# Patient Record
Sex: Female | Born: 1935 | Race: Black or African American | Hispanic: No | Smoking: Never smoker
Health system: Southern US, Community
[De-identification: ages and names within clinical notes are randomized; demographics above are authoritative.]

## PROBLEM LIST (undated history)

## (undated) DIAGNOSIS — H409 Unspecified glaucoma: Secondary | ICD-10-CM

## (undated) DIAGNOSIS — F0390 Unspecified dementia without behavioral disturbance: Secondary | ICD-10-CM

## (undated) DIAGNOSIS — H547 Unspecified visual loss: Secondary | ICD-10-CM

## (undated) DIAGNOSIS — I10 Essential (primary) hypertension: Secondary | ICD-10-CM

## (undated) HISTORY — DX: Essential (primary) hypertension: I10

## (undated) HISTORY — DX: Unspecified visual loss: H54.7

## (undated) HISTORY — DX: Unspecified dementia, unspecified severity, without behavioral disturbance, psychotic disturbance, mood disturbance, and anxiety: F03.90

## (undated) HISTORY — DX: Unspecified glaucoma: H40.9

---

## 2005-06-15 ENCOUNTER — Other Ambulatory Visit: Admission: RE | Admit: 2005-06-15 | Discharge: 2005-06-15 | Payer: Self-pay | Admitting: Family Medicine

## 2005-11-02 ENCOUNTER — Inpatient Hospital Stay (HOSPITAL_COMMUNITY): Admission: EM | Admit: 2005-11-02 | Discharge: 2005-11-07 | Payer: Self-pay | Admitting: Emergency Medicine

## 2005-11-07 ENCOUNTER — Ambulatory Visit: Payer: Self-pay | Admitting: Internal Medicine

## 2005-11-09 ENCOUNTER — Ambulatory Visit: Payer: Self-pay | Admitting: Internal Medicine

## 2006-04-05 ENCOUNTER — Emergency Department (HOSPITAL_COMMUNITY): Admission: EM | Admit: 2006-04-05 | Discharge: 2006-04-05 | Payer: Self-pay | Admitting: Emergency Medicine

## 2007-02-08 HISTORY — PX: FRACTURE SURGERY: SHX138

## 2007-07-16 ENCOUNTER — Ambulatory Visit: Payer: Self-pay | Admitting: Cardiology

## 2007-07-27 ENCOUNTER — Ambulatory Visit: Payer: Self-pay | Admitting: Cardiology

## 2007-07-28 ENCOUNTER — Encounter: Payer: Self-pay | Admitting: Ophthalmology

## 2007-07-28 ENCOUNTER — Inpatient Hospital Stay (HOSPITAL_COMMUNITY): Admission: EM | Admit: 2007-07-28 | Discharge: 2007-08-03 | Payer: Self-pay | Admitting: Emergency Medicine

## 2007-12-17 ENCOUNTER — Encounter: Admission: RE | Admit: 2007-12-17 | Discharge: 2007-12-17 | Payer: Self-pay | Admitting: *Deleted

## 2008-06-09 ENCOUNTER — Encounter: Admission: RE | Admit: 2008-06-09 | Discharge: 2008-06-09 | Payer: Self-pay | Admitting: *Deleted

## 2009-07-11 IMAGING — CR DG CHEST 1V
1 series · 1 of 1 positions shown · non-contrast
Comparison: 11/06/2005

CLINICAL DATA: , fall, left hip fracture

CHEST - 1 VIEW

[t chest supine]
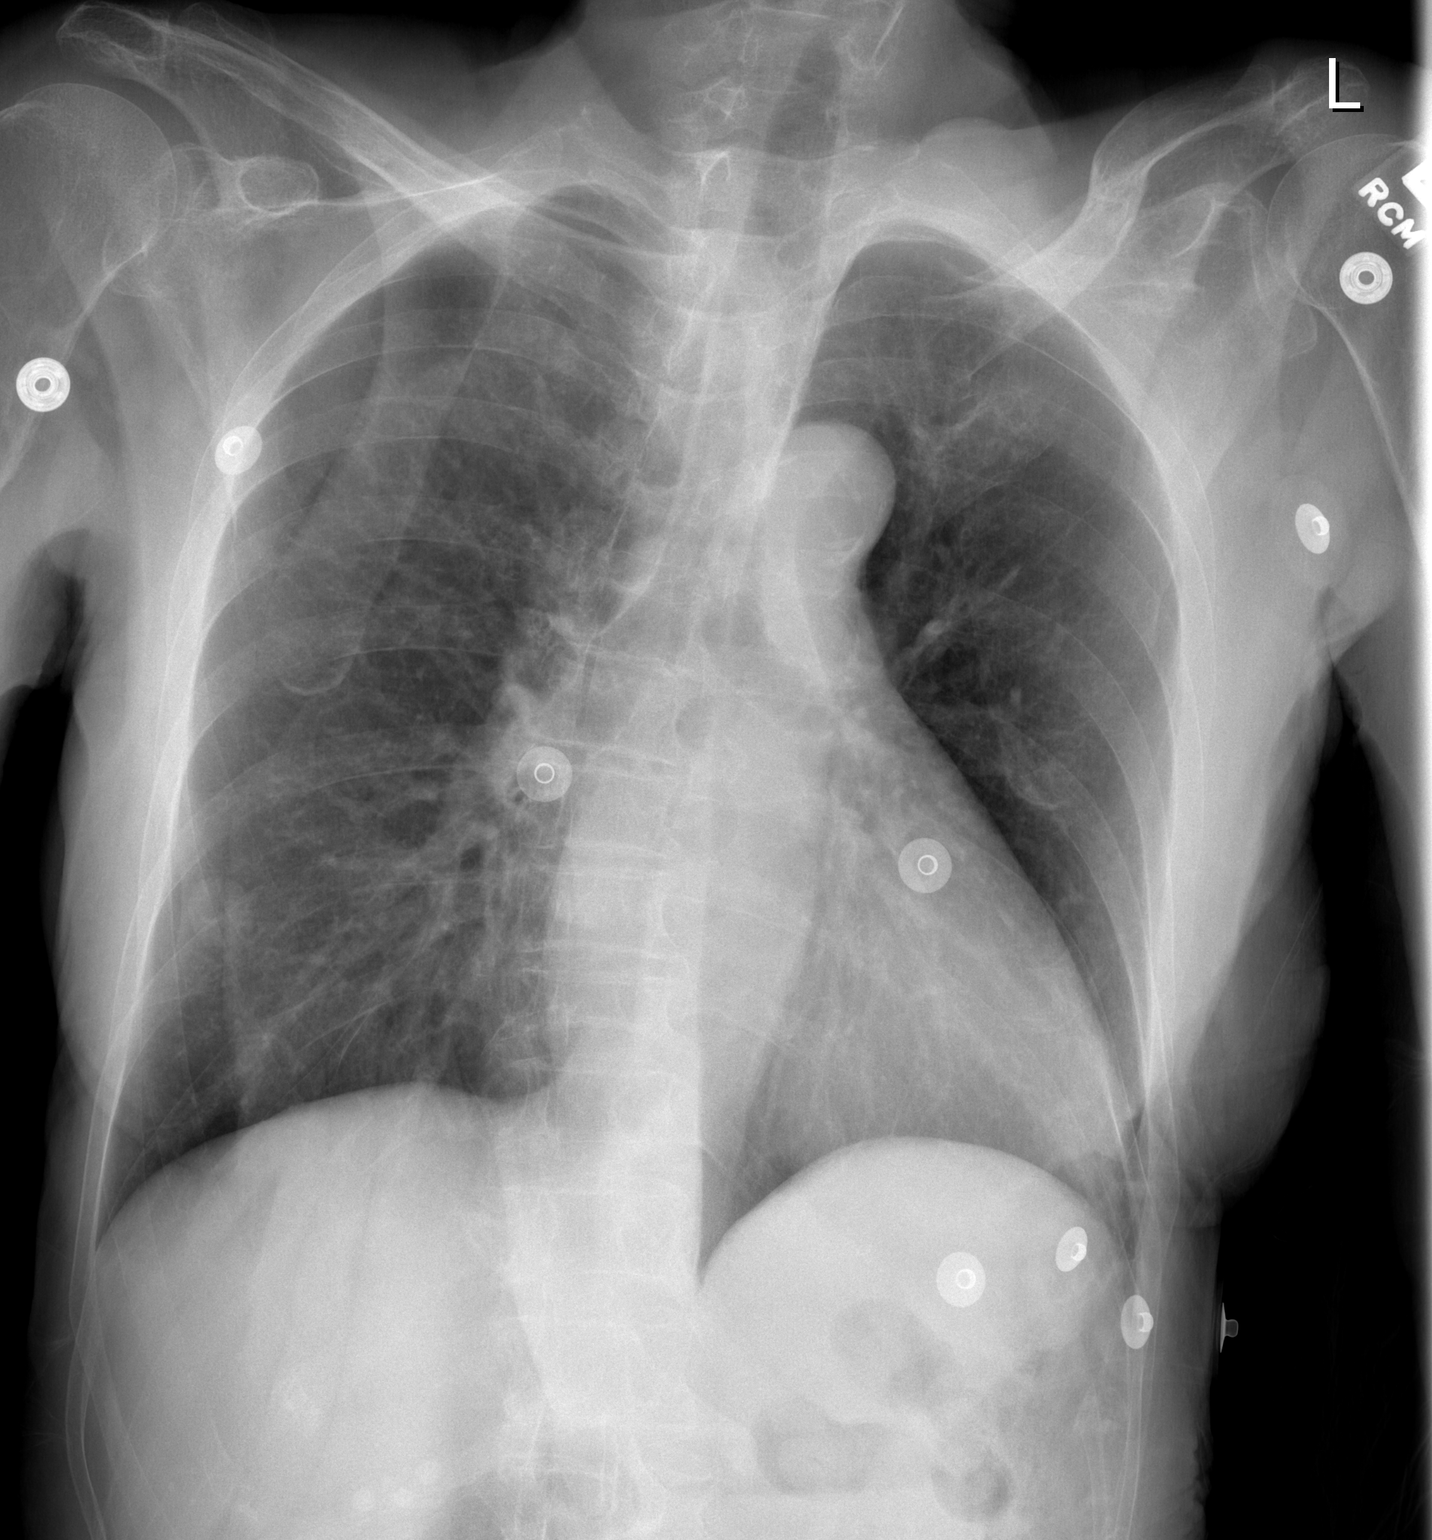

[1 of 1 positions shown; findings below may reference images not displayed]

FINDINGS: Cardiomegaly is present.  There are no acute infiltrates
or failure.  Nodular densities project over the lower lung zones,
likely representing nipples.  Skeletal osteopenia is noted with
mild thoracic scoliosis convex right.
IMPRESSION: Cardiomegaly, no active disease; increased cardiac size from priors
otherwise no significant change.

Nodular densities projecting over both lower lung zones likely
represent nipple shadows.  Correlate clinically

## 2009-07-12 IMAGING — RF DG HIP OPERATIVE*L*
1 series · 2 of 2 positions shown · non-contrast
Comparison: 07/27/2007

CLINICAL DATA: Status post hip pinning

OPERATIVE LEFT HIP

[Series 1: run · 2 of 2 slices shown]
[im 1/2]
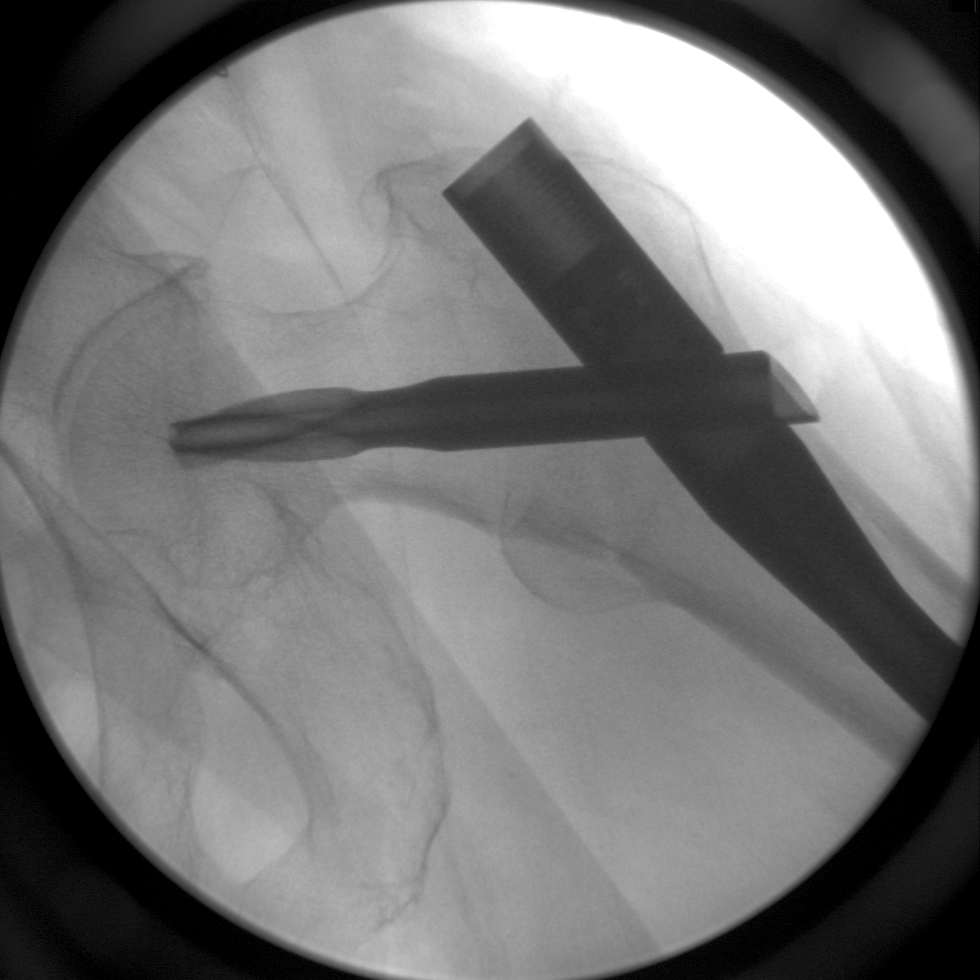
[im 2/2]
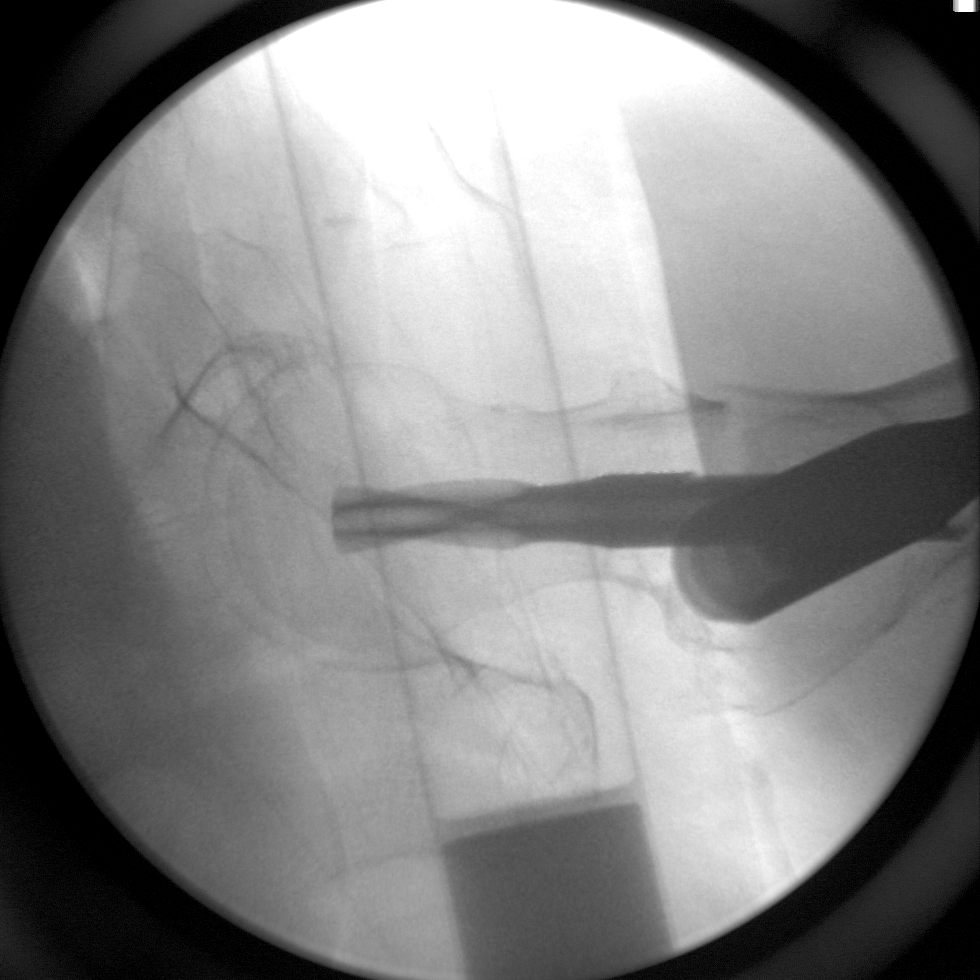

[2 of 2 positions shown; findings below may reference images not displayed]

FINDINGS: Two intraoperative films from C-arm radiography show
placement of medullary rod and hip pin..  The fracture fragments
are in anatomic alignment and no complicating features are
identified.
IMPRESSION: Status post open reduction and internal fixation of left hip
fracture.

## 2010-05-25 IMAGING — CR DG FEMUR 2+V*R*
5 series · 5 of 5 positions shown · non-contrast
Comparison: AP pelvis and lumbar spine series 04/05/2006.

CLINICAL DATA: 89-year-old female status post fall 1 week ago.
Right leg pain and limping for 1 week.  The patient also complains
of left hip pain.  Status post left hip surgery 1 year ago.

PELVIS - 1-2 VIEW,
RIGHT FEMUR - 2 VIEW

[view not recorded (1 of 5)]
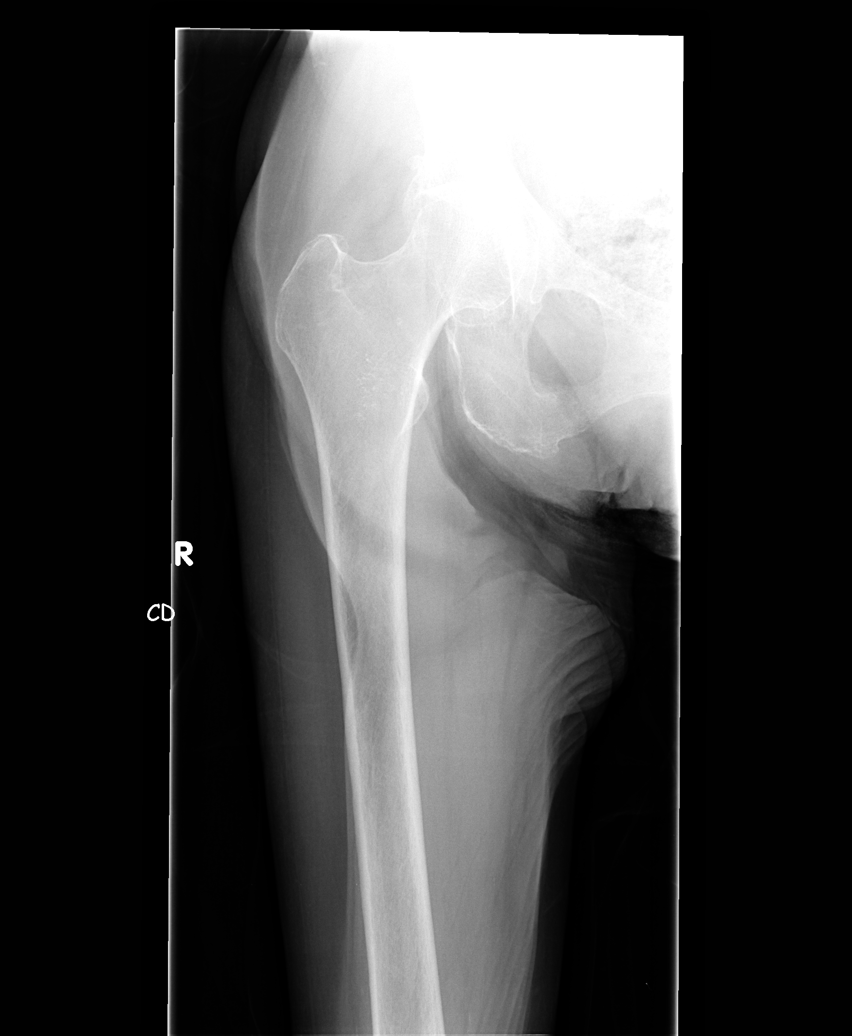

[view not recorded (2 of 5)]
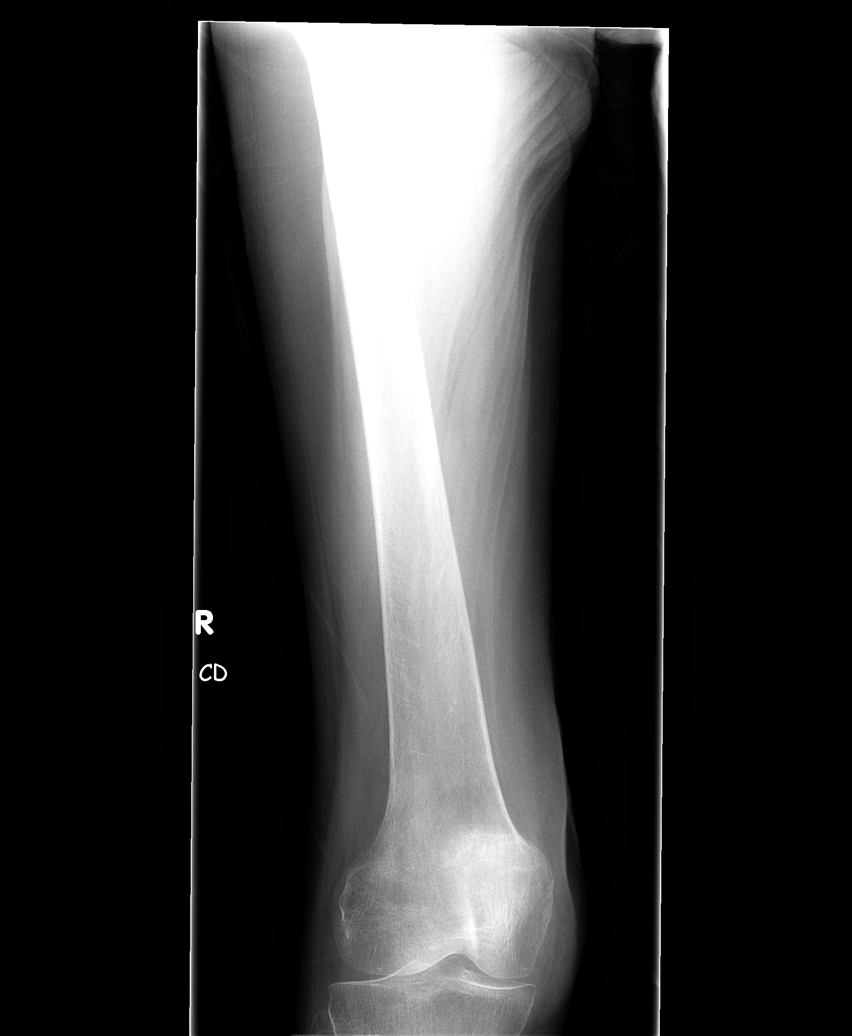

[view not recorded (3 of 5)]
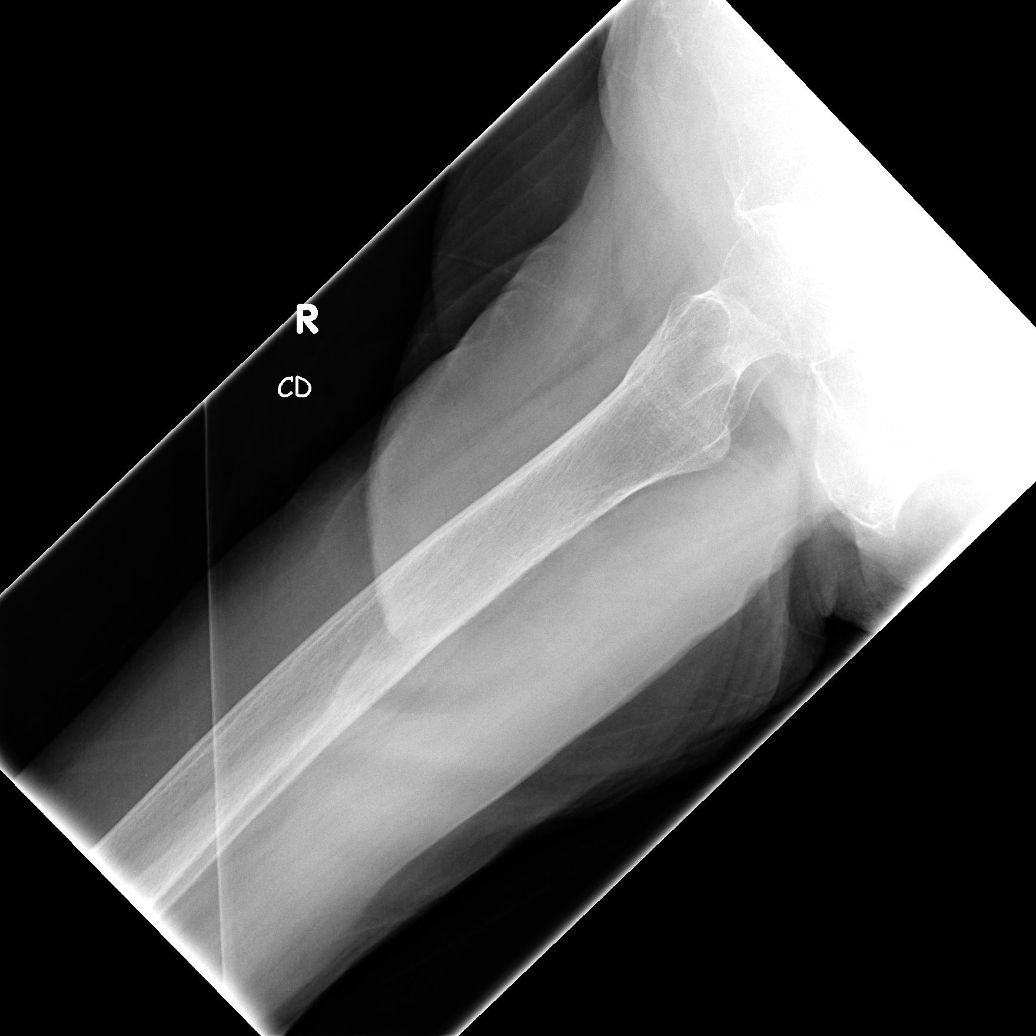

[view not recorded (4 of 5)]
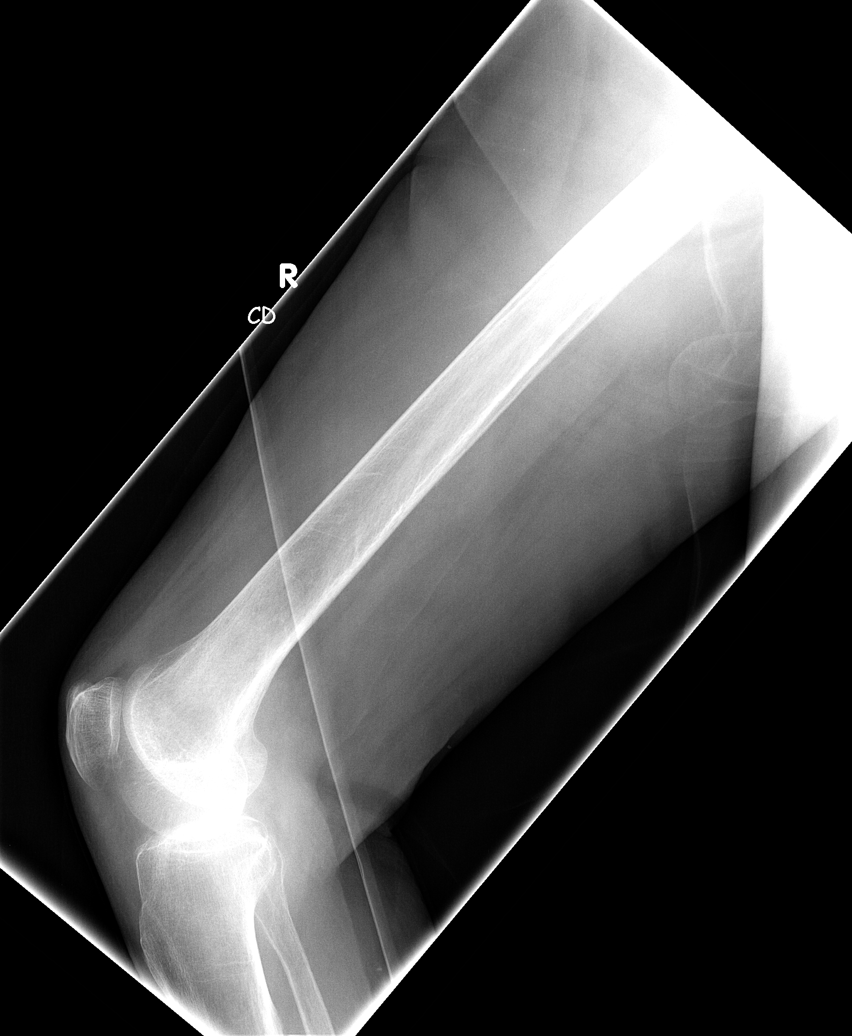

[view not recorded (5 of 5)]
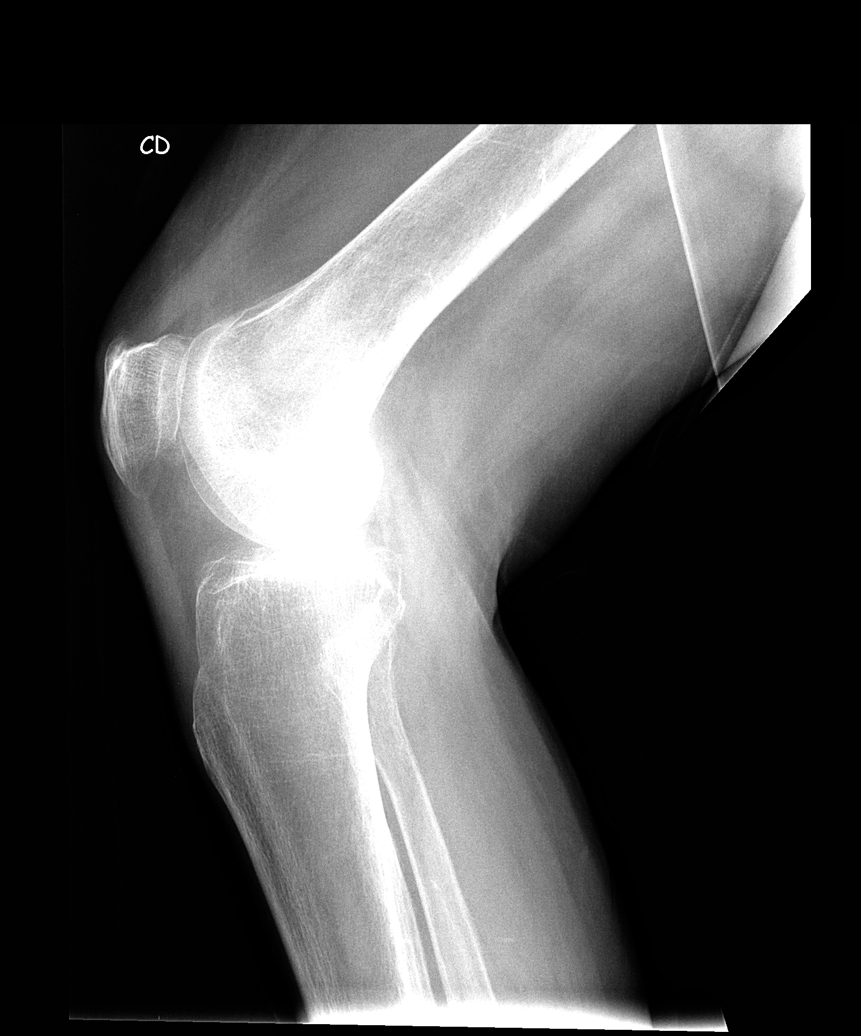

[5 of 5 positions shown; findings below may reference images not displayed]

FINDINGS: Pelvis:  Diffuse osteopenia.  Interval sequelae of dynamic left hip
screw and left femur intramedullary rod placement.  Heterotopic
ossification about the greater trochanter is noted.  The left
femoral head appears normally located.  The visualized left femoral
hardware appears intact, but there is lucency surrounding the
cephalad most aspect of the IM rod.  There is irregularity and
subtle fragmentation of the left inferior pubic ramus.  The
remaining pubic rami appear intact.  Fibroid calcifications in the
pelvis are again noted.  Iliac wings and sacrum appear grossly
intact.  There is apparent compression of the L4 vertebral body
which, if genuine, is new from the previous exams.

Right femur: Diffuse osteopenia. Right femoral head is normally
located.  Right hip joint space is stable.  Visualized right hemi
pelvis appears intact. Right femur appears intact.
IMPRESSION: 1.  Minimally-displaced left inferior pubic ramus fracture
suspected.  Pelvis otherwise appears intact.
2.  Lucency adjacent to the proximal left femur hardware suspicious
for loosening. Visualized left femoral appears intact.
3.  Right femur appears intact.
4.  Evidence of interval L4 compression fracture.

## 2010-06-22 NOTE — Discharge Summary (Signed)
NAMEVINETA, CARONE              ACCOUNT NO.:  [id number]   MEDICAL RECORD NO.:  [id number]          PATIENT TYPE:  INP   LOCATION:  3715                         FACILITY:  [facility]   PHYSICIAN:  Lonia Blood, M.D.DATE OF BIRTH:  1919/11/12   DATE OF ADMISSION:  07/27/2007  DATE OF DISCHARGE:  08/03/2007                               DISCHARGE SUMMARY   PRIMARY CARE PHYSICIAN:  Immunologist Care.   ORTHOPEDIST:  Veverly Fells. Ophelia Charter, M.D.   DISCHARGE DIAGNOSES:  1. Left intertrochanteric and subtrochanteric hip fracture, status      post surgical correction.  2. Postoperative anemia requiring blood transfusion.  3. Hypertension.  4. Sinus bradycardia.  5. Severe dementia.  6. Glaucoma.  7. One out of two positive blood cultures for coagulase-negative      Staphylococcus, not felt to be clinically significant.   DISCHARGE MEDICATIONS:  1. Aspirin enteric-coated 81 mg p.o. daily.  2. Senokot 1 tablet p.o. nightly.  3. Namenda 5 mg p.o. b.i.d.  4. Depakote 125 mg p.o. daily.  5. Xalatan ophthalmic solution 0.05% 1 drop in each eye daily.  6. Alphagan 0.2% ophthalmic solution 1 drop in each eye b.i.d.  7. Timoptic 0.5% ophthalmic solution 1 drop in each eye b.i.d.  8. Protonix 40 mg p.o. daily.  9. Lovenox 40 mg subcutaneously q.24 h. until such time that the      patient's mobility is improved to the point that she no longer is      felt to require DVT prophylaxis.  10.Zofran 4 mg p.o. q.8 h. p.r.n.  11.Tylenol 650 mg p.o. q.4 h. p.r.n.  12.Oxycodone 4 mg p.o. q.4 h. p.r.n.  13.Dulcolax suppository 10 mg per rectum p.r.n.   FOLLOW-UP:  1. The patient is advised to follow up with Dr. Annell Greening in his      office in 2 weeks.  The nursing facility should call his office to      arrange for this follow-up.  2. Ongoing primary care will be provided by the attending of record at      the patient's nursing home of choice.  The patient's hemoglobin      should be followed up  within the week to assure that it remains      stable.  At the time of her discharge her hemoglobin is 10.2.   PROCEDURES:  1. Left intramedullary trochanteric nail with 85-mm lag screw July 28, 2007, by Dr. Annell Greening.  2. CT scan of the head July 27, 2007:  No acute intracranial      abnormalities.  Chronic small-vessel ischemic change and brain      atrophy.  3. CT scan of the cervical spine:  Negative for acute fracture-      dislocation.   CONSULTATIONS:  1. Mark C. Ophelia Charter, MD, with orthopedic surgery.  2. Luis Abed, MD, with [facility].   HOSPITAL COURSE:  Ms. Simren Popson is an 77 year old female who was  cared for previously in a private residence with close attention from  her family.  She was found  down on the floor of the bathroom at her  home.  When she was awakened, she was drowsy and somewhat confused.  The  patient does have a significant baseline dementia.  She was brought to  the emergency room.  There she was diagnosed with a left  intertrochanteric and subtrochanteric hip fracture.  CT scan of the head  was unrevealing.  Clinically the patient appeared stable otherwise.  She  did have a history of bradycardia.  She was admitted to the medical  service for evaluation and clearance.  Cardiology was consulted.  The  patient was felt to be stable for surgery with no further intervention.  The patient was taken to the operating room on July 28, 2007, and  underwent the above-specified procedures.  Postoperatively the patient  suffered acute blood loss anemia.  She required transfusion of blood  products.  With said transfusion, the patient improved significantly.  Sinus bradycardia was noted throughout the hospitalization but no  significant hypotension or symptoms were appreciated.  The patient did  have a low-grade fever postoperatively.  Blood cultures and urine  cultures were obtained.  Urinalysis and urine culture were unrevealing.  Chest  x-ray was unrevealing.  One out of two blood cultures did return  positive for gram-positive cocci, which turned out to be coagulase-  negative staph.  The patient had been empirically covered with  vancomycin for this.  There was no evidence of wound infection per the  orthopedic service and no other clinical evidence of infection.  As a  result, antibiotics were not felt to be necessary at the present time.  Physical therapy and occupational therapy evaluated the patient and it  was felt that she would require a skilled nursing facility for very long-  term rehab from the standpoint of her left hip fracture.  Arrangements  were made via case management.  By August 03, 2007, the patient was deemed  to be stable from a medical standpoint for discharge to a skilled  nursing facility.  She was cleared from an orthopedic standpoint as  well.  Follow-up is as noted above.   WOUND CARE:  The patient's left hip wound is to be dressed with a  Mepilex dressing and changed on a daily basis.   ACTIVITY:  Physical therapy and occupational therapy should evaluate the  patient on an ongoing basis in the nursing facility.  She is  weightbearing as tolerated for her left hip.  Further advancement will  be made after follow-up with Dr. Ophelia Charter.      Lonia Blood, M.D.  Electronically Signed     JTM/MEDQ  D:  08/03/2007  T:  08/03/2007  Job:  [id number]   cc:   [facility] C. Ophelia Charter, M.D.

## 2010-06-22 NOTE — Assessment & Plan Note (Signed)
[facility]                            CARDIOLOGY OFFICE NOTE   TARRI, GUILFOIL                       MRN:          [id number]  DATE:07/16/2007                            DOB:          29-Jun-1919    Ms. Pat is referred for clearance for eye surgery and for the  evaluation of bradycardia.  The patient is 77.  She is here with her  daughter.  She has significant Alzheimer's.  At this point, she is taken  to a day care unit and mostly sits there all day and waits for her  daughter to come back in the afternoon.  She is able to walk when she is  with something.  She is not having chest pain or shortness of breath.  There has been no syncope or presyncope.  She does have some peripheral  edema.  Her daughter says that in fact her peripheral edema is somewhat  better than it has been.  There is no prior documented coronary disease.   The patient has some type of eye finding, and there is a question as to  whether or not she will have surgery.  That will be up to  Dr. Ashley Royalty and the patient's daughter.  However, from the viewpoint of  her heart, we need to decide if it is safe with her bradycardia.   PAST MEDICAL HISTORY:   ALLERGIES:  No known drug allergies.   MEDICATIONS:  Aspirin, Namenda, Exelon, Depakote, and eyedrops.   OTHER MEDICAL PROBLEMS:  See the list below.   SOCIAL HISTORY:  The patient is living with her daughter and as  mentioned has significant Alzheimer's.   FAMILY HISTORY:  The family history is noncontributory.   REVIEW OF SYSTEMS:  At this point, review of systems is limited.  She is  not having any major complaints at this moment.   PHYSICAL EXAMINATION:  Weight is 110 pounds.  Blood pressure is 190/81  with a pulse of 43.  The patient is responsive.  She is not fully oriented.  HEENT:  No xanthelasma.  She has normal extraocular motion.  There are no carotid bruits.  There is no jugular venous distention.  LUNGS:   Lungs are clear.  Respiratory effort is not labored.  CARDIAC:  An S1 with an S2.  There are no clicks or significant murmurs.  ABDOMEN:  Soft.  There is 1+ peripheral edema.   EKG reveals a sinus bradycardia with a rate of 41.   PROBLEMS:  1. Significant Alzheimer's.  2. Eye abnormality with question of need for eye surgery.  3. Marked bradycardia with no symptoms.  4. Hypertension.  5. Mild peripheral edema.   The patient's bradycardia certainly is impressive.  However, she has  absolutely no symptoms.  She is not hypotensive.  In fact, she is  hypertensive.  She has some mild peripheral edema.  She has not had any  syncope or presyncope, and she has significant Alzheimer's at the age  77.  At this time, I feel that it is not indicated to place a pacemaker.  I explained  to the daughter why I felt this.  She understands.  I would  suggest proceeding with the eye surgery if it is felt to be clinically  indicated.  I would also consider very mild medications for her blood  pressure.  No further workup is needed for her bradycardia at this time.  Also, she is cleared for eye surgery.     Luis Abed, MD, [facility]  Electronically Signed    JDK/MedQ  DD: 07/16/2007  DT: 07/17/2007  Job #: [id number]   cc:   Beulah Gandy. Ashley Royalty, M.D.  Samara Snide, MD

## 2010-06-22 NOTE — H&P (Signed)
[name], [name]              ACCOUNT NO.:  [id number]   MEDICAL RECORD NO.:  [id number]          PATIENT TYPE:  INP   LOCATION:  3703                         FACILITY:  [facility]   PHYSICIAN:  Lucita Ferrara, MD         DATE OF BIRTH:  May 29, 1919   DATE OF ADMISSION:  07/27/2007  DATE OF DISCHARGE:                              HISTORY & PHYSICAL   The patient is an 77 year old presenting with status post fall, brought  in by EMS.  Apparently the patient was found at home by daughter on the  floor of bathroom.  She was awakened.  She was drowsy and not talking  well.  Daughter and husband with the patient.  They are unsure about  when the patient fell or if there was any prodrome such as chest pain,  shortness of breath, nausea, vomiting, dizziness.  She was in her usual  state of health.  The patient has severe dementia, thus the rest of the  history is pretty unreliable.   PAST MEDICAL HISTORY:  1. Hypertension.  2. Alzheimer disease.  3. Glaucoma.  4. Peripheral edema.  5. Bradycardia.  Note that patient has recently been cleared by      [facility] for cataract surgery.  She was also evaluated by      [facility] for bradycardia.   SOCIAL HISTORY:  She lives with her daughter.  She is a nonsmoker.  Denies drugs or alcohol.  She is limited on her activities of daily  living for support.   ALLERGIES:  No known drug allergies.   MEDICATIONS:  Xalatan Ophthalmic, Combigan, Divalproex, Exelon, Bayer  Aspirin.  Doses not yet verified.   PHYSICAL EXAMINATION:  She is a cachectic, uncomfortable-appearing  female.  Blood pressure is 186/81, pulse 54, respirations 18, temperature 98.9,  pulse oximetry 99%.  HEENT:  Normocephalic, atraumatic.  Mucous membranes dry.  CARDIOVASCULAR:  S1, S2.  Regular rate and rhythm.  No murmurs, rubs,  clicks.  ABDOMEN:  Soft, nontender, not distended.  Positive bowel sounds.  LUNGS:  Clear to auscultation bilaterally.  No rhonchi,  rales or  wheezes.  EXTREMITIES:  Trace edema.  There is deformity with rotation, shortening  to the left lower leg, some swelling to the left knee.  NEUROLOGIC:  Cannot be fully assessed secondary to her mental status.  Right arm strength 3/5.  Left arm strength 3/5.  Left leg strength 3/5  right.  Leg strength 3/5.   EMERGENCY ROOM COURSE:  The ED physician called Dr. Ophelia Charter, who suggested  calf compression rather than Lovenox, Buck's traction, n.p.o. after  midnight and he will see in the morning.  In the emergency room due to  potential head and cervical trauma, the patient was placed in a C-  collar.  CT of the head and C-spine were performed.  EKG shows sinus  bradycardia at 57, nonspecific ST-T wave changes   LABORATORY RESULTS:  Troponins negative.  CK-MB negative.  Complete  metabolic panel:  High glucose, otherwise normal.  Urinalysis normal.  Bleeding time normal.  CBC pending.  CT scan  of the head without  contrast shows no acute intracranial abnormalities, small-vessel  ischemic changes and brain atrophy.  C-spine CT:  C-spine shows negative  for fractures or dislocation.  Chest x-ray shows cardiomegaly, no active  cardiopulmonary disease, increased cardiac size from prior.  Knee two-  view:  Normal.  X-ray of the hip showed comminuted intertrochanteric  left hip fracture with angulation.   ASSESSMENT/PLAN:  An 77 year old status post fall and fracture of the  intertrochanteric left hip with angulation, status post orthopedics  recommendation, who will see in the morning.   1. Fall with comminuted fracture of intratrochanteric left hip with      angulation.  2. Bradycardia.  Will likely need cardiac clearance.  3. Uncontrolled hypertension.  4. Alzheimer disease.  5. Glaucoma.  6. Peripheral edema.   PLAN:  Will continue plan per orthopedics recommendations including  Buck's traction, n.p.o. after midnight.  Per orthopedics recommendation,  use SCD boots and not  Lovenox.  We will reinstitute home medications.  Control blood pressure with non-rate-controlling medicine.  Pain  control.  The rest of plans are dependent on her progress.  I will defer  to cardiology for full clearance prior to surgery.      Lucita Ferrara, MD  Electronically Signed     RR/MEDQ  D:  07/28/2007  T:  07/28/2007  Job:  [id number]

## 2010-06-22 NOTE — Consult Note (Signed)
[name], [name]              ACCOUNT NO.:  [id number]   MEDICAL RECORD NO.:  [id number]          PATIENT TYPE:  INP   LOCATION:  3703                         FACILITY:  [facility]   PHYSICIAN:  Darryl D. Prime, MD    DATE OF BIRTH:  14-Aug-1919   DATE OF CONSULTATION:  DATE OF DISCHARGE:                                 CONSULTATION   PRIMARY CARE PHYSICIAN:  Seraphine A. Soosaimanickam, M.D.   TOTAL VISIT TIME:  Approximately 58 minutes.   The patient could not give a history.  History was revised primarily by  the daughter due to significant confusion and dementia.  She is being to  [facility].   CONSULT PHYSICIAN:  [facility].   CARDIOLOGIST:  Luis Abed, MD.   REASON FOR CONSULT:  History of asymptomatic bradycardia.   CHIEF COMPLAINT:  The daughter brought in for hip pain and she was found  to have a hip fracture.   HISTORY OF PRESENT ILLNESS:  [name] is an 77 year old female with a  history of asymptomatic bradycardia, who underwent a preoperative  evaluation by Dr. Myrtis Ser on July 16, 2007.  The patient was scheduled to  have cataract surgery by Dr. Ashley Royalty.  She was cleared for surgery by  Dr. Myrtis Ser with no indication for a pacemaker at that time, but her  daughter also that she should not undergo the surgery.  The patient has  significant visual problems due to her cataract.  The patient apparently  fell today in her bathroom and when her daughter returned home, found  her on a floor in the bathroom with external rotation of the right hip  and for shortening of the right leg.  The patient in the emergency room  was given fentanyl IV fluids and was found to have an x-ray of the hip,  a comminuted fracture in the intertrochanteric region with angulation.  She has also osteopenia noted.  X-ray of the knee on the left was  unremarkable.  The patient have a history of low heart rates in the  range of 40s to 50s over the last month.  The patient is on Exelon.   PAST MEDICAL HISTORY:  Was obtained from her daughter.  1. History of peripheral edema.  2. History of Alzheimer disease.  3. History of asymptomatic bradycardia.  4. History of glaucoma.  5. History of hypertension.  6. History of fall.  7. History of pubic ramus fracture in 2007.  8. She has a history of cataracts.   ALLERGIES:  No known drug allergies.   MEDICATIONS:  Unsure of the dose, but she is on aspirin, Depakote,  Namenda, Exelon, and Xalatan and Combigan are eye drops.   SOCIAL HISTORY:  She lives with her daughter and her daughter's husband.  The patient was a former nurse work with surgery.  The patient is  widowed.  No history of tobacco, alcohol, or illicit drug use.   FAMILY HISTORY:  Her mother died of old age, but her father was deceased  at a young age, unsure of the related illness was unknown.   REVIEW  OF SYSTEMS:  Could not obtain secondary to the patient being  confused with a history of dementia.   PHYSICAL EXAMINATION:  VITAL SIGNS:  Temperature is 98.9 with pulse of  64, respiratory rate of 14, blood pressure 172/88 with saturations of  99% on room air.  GENERAL:  The patient is very thin, lying flat in bed in no acute  distress.  HEENT:  Normocephalic and atraumatic.  Cataracts were seen.  The  patient's oropharynx was very dry.  NECK:  Supple with no lymphadenopathy or thyromegaly.  No carotid  bruits.  CARDIOVASCULAR:  Regular rhythm and rate with no murmurs, rubs, or  gallops.  Normal S1 and S2.  No S3 or S4.  LUNGS:  Clear to auscultation bilaterally.  ABDOMEN:  Scaphoid, soft, nontender, and nondistended with no  hepatosplenomegaly.  EXTREMITIES:  Her left extremity is casted and under traction.  NEUROLOGIC:  The patient is aware of who she is, but she is not aware of  where she is.  She is hence not aware of what occurred recently.  She is  also unaware of the time.   LABORATORY DATA:  She had a white count of 10.4 with a hemoglobin of   12.6, hematocrit of 38, platelets 112, and segs of 91.  Sodium 142,  potassium 4.0, chloride 107, bicarb 27, BUN 15, creatinine 0.95 with a  glucose of 165.  Cardiac markers were negative at 2100.  Urinalysis was  negative.  T bili 1.8 otherwise normal LFTs.  CT of the head showed a  small vessel disease changes and the brain atrophy, but no acute  disease.  CT of C-spine was negative.  Chest x-ray showed possible  cardiomegaly, otherwise no acute disease.  The patient's EKG showed  normal sinus rhythm with a ventricular rate of  61 beats per minute.  PR  interval was 171, QRS 79, QT corrected at 411.   ASSESSMENT AND PLAN:  This is a patient with a history of asymptomatic  bradycardia, who now has a left hip fracture after a fall.  It is okay  that she undergo surgery, and considering the risk versus benefits, we  would prefer much better after having a surgery.  For her history of  bradycardia, we will continue her home medications.  She does have  significant hypertension.  Would avoid clonidine as there are cases of  bradycardia associated with clonidine.  I would avoid anticholinergic  such as Atropine due to her history of glaucoma.  Suggest dopamine if  she becomes bradycardic and also if her hypertension is significant.  We  will suggest nitroglycerin drip or sublingual nitroglycerin as needed.  We will follow closely with you.      Darryl D. Prime, MD  Electronically Signed     DDP/MEDQ  D:  07/28/2007  T:  07/28/2007  Job:  [id number]

## 2010-06-22 NOTE — Op Note (Signed)
NAMEEMILLY, LAVEY              ACCOUNT NO.:  [id number]   MEDICAL RECORD NO.:  [id number]          PATIENT TYPE:  INP   LOCATION:  3715                         FACILITY:  [facility]   PHYSICIAN:  Mark C. Ophelia Charter, M.D.    DATE OF BIRTH:  1919/08/18   DATE OF PROCEDURE:  07/28/2007  DATE OF DISCHARGE:                               OPERATIVE REPORT   PREOPERATIVE DIAGNOSIS:  Left intertrochanteric and subtrochanteric  fracture.   POSTOPERATIVE DIAGNOSIS:  Left intertrochanteric and subtrochanteric  fracture.   PROCEDURE:  Left intramedullary trochanteric nail Synthes 11 x 400 mm  with 85-mm lag screw.   SURGEON:  Mark C. Ophelia Charter, MD.   ANESTHESIA:  General endotracheal plus Marcaine local.   ESTIMATED BLOOD LOSS:  100 mL.   PROCEDURE:  After induction of general anesthesia, time-out procedure  after prepping and draping stabilization, the hip was prepped with  DuraPrep.  Extremity was draped using four squared towels in the large  shower curtain, Betadine Steri-Drape.  Preoperative Ancef was given.  Safety surgical checklist was performed.  Incision was made started  proximal to the trochanter.  Tip of the trochanter was identified,  checked under fluoroscopy with the confirmation, the reduction was  anatomic surprisingly for intertrochanter and subtrochanter.  The  Steinmann pin was drilled in, over reamed and then measured over the  skin appropriate length and 11 x 400 mm nail was selected, inserted.  Pin was drilled up using the external guide center extended first time  and measured at 985, it was selected which gave good cortical bite as it  was impacted in with the hammer.  It was locked proximally and then  screw was used to lock the 85-mm lag screw after AP and lateral  fluoroscopy showed it was in good position.  Guide was removed  laterally.  Wounds were irrigated and then closed with 0-Vicryl deep  layer, 2-0 Vicryl subcutaneous tissue, skin with staple closure.  Postop  dressing, Adaptic, 4x4s, ABD and transferred to recovery room.  Leg  lengths were equal.  Spot fluoro pictures were taken confirming  reduction.  Distal tip was checked at the knee and the patient was then  taken out of the well leg holder.  The traction was released prior to  tightening down and locking the compression screw and then transferred  to the recovery room in stable condition.  Instrument count and needle  count was correct.      Mark C. Ophelia Charter, M.D.  Electronically Signed     MCY/MEDQ  D:  07/28/2007  T:  07/29/2007  Job:  [id number]

## 2010-06-25 NOTE — H&P (Signed)
[name], [name]              ACCOUNT NO.:  [id number]   MEDICAL RECORD NO.:  [id number]          PATIENT TYPE:  EMS   LOCATION:  ED                           FACILITY:  [facility]   PHYSICIAN:  Melissa L. Ladona Ridgel, MD  DATE OF BIRTH:  09-18-1919   DATE OF ADMISSION:  11/02/2005  DATE OF DISCHARGE:                                HISTORY & PHYSICAL   CHIEF COMPLAINT:  Cannot walk secondary to a fall.   PRIMARY CARE PHYSICIAN:  Joycelyn Rua, M.D.   HISTORY OF PRESENT ILLNESS:  The patient is an 77 year old African-American  female with a history of dementia and glaucoma, who stepped off of her front  steps today, landing on the ground in the bushes.  The patient sustained a  pubic ramus fracture and has been unable to fully weightbear secondary to  pain.  She was seen by orthopedics in the emergency room.  We have been  requested to admit the patient for supportive care.   PAST MEDICAL HISTORY:  Glaucoma.  She denies hypertension and diabetes.  She  definitely has dementia.   PAST SURGICAL HISTORY:  None.   SOCIAL HISTORY:  She was a Engineer, civil (consulting), evidently working in surgery.  She denies  tobacco or ethanol.  She had twin girls and one other daughter and one  deceased child.  She is also widowed.   FAMILY HISTORY:  Mom is deceased with only age as her diagnosis.  Dad is  deceased young but medical illnesses were unknown.   ALLERGIES:  No known drug allergies.   MEDICATIONS:  1. Aspirin 81 mg.  2. Glaucoma drops which are unknown at the time.   PHYSICAL EXAMINATION:  VITAL SIGNS:  Temperature is 97.6, blood pressure  161/82, pulse of 55, respirations 18, saturation 98%.  GENERAL:  She is in no acute distress with mild confabulation.  HEENT:  She is normocephalic, atraumatic.  Pupils equal, round, and reactive  to light, extraocular muscles are intact.  Mucous membranes are moist.  NECK:  Supple.  There is no JVD, no lymph nodes and no carotid bruits.  CHEST:  Decreased but  clear.  CARDIOVASCULAR:  Regular rate and rhythm, positive S1, S2, no S3, S4, no  murmurs, rubs or gallops.  ABDOMEN:  Soft with minimal tenderness over the left lower quadrant.  She  otherwise has positive bowel sounds and no distention.  EXTREMITIES:  Mild pain with flexion of the hip on the left.  No edema is  noted.  NEUROLOGIC:  She does confabulate.  She is oriented to self and her daughter  but does not know the year or the place.  Plantars are downgoing.  DTRs are  2.   LABORATORY DATA:  CT scan and x-ray confirm a left superior ramus fracture.  She has no other laboratories.   ASSESSMENT AND PLAN:  This is an 77 year old African-American female status  post a fall, who sustained a superior ramus fracture, now has difficulty  walking secondary to pain.  We have been asked to admit for supportive care.   1. Admit her to a general medical bed.  2. Will check a CBC, BMET, UA, culture and sensitivity.  3. Will obtain a PT/OT evaluation in the morning.  4. Glaucoma.  The patient's daughter will call with her glaucoma      medications and we will get those restarted.      Melissa L. Ladona Ridgel, MD  Electronically Signed     MLT/MEDQ  D:  11/02/2005  T:  11/03/2005  Job:  [id number]   cc:   Joycelyn Rua, M.D.  Fax: [phone]

## 2010-06-25 NOTE — Discharge Summary (Signed)
[name]              ACCOUNT NO.:  [id number]   MEDICAL RECORD NO.:  [id number]          PATIENT TYPE:  INP   LOCATION:  1429                         FACILITY:  [facility]   PHYSICIAN:  Melissa L. Ladona Ridgel, MD  DATE OF BIRTH:  01/31/1920   DATE OF ADMISSION:  11/02/2005  DATE OF DISCHARGE:  11/07/2005                                 DISCHARGE SUMMARY   ADDENDUM:  Please see the previously dictated discharge summary and note the  following:  The patient was unable to transfer to the nursing care facility  over the weekend and has remained clinically stable during that period of  time.  She was noted on September 29 to have a low-grade temperature of  100.3.  A urinalysis was sent which was negative for any urinary tract  infection.  A chest x-ray was also obtained which showed no acute disease.  At this time, I do not feel that any antibiotics are warranted, as I do not  feel that there is a source for infection.  The patient is stable for  discharge at this time.   Her physical exam on the day of discharge:  VITAL SIGNS:  Remains with a temperature of 97 degrees, blood pressure  145/73, pulse 52, respirations 20, saturations 100%.  GENERAL:  This is a well-developed, well-nourished African-American female  who is pleasantly demented and confabulates.  HEENT:  She is normocephalic, atraumatic.  Pupils equal, round, reactive to  light.  Extraocular muscles are intact.  Mucous membranes are moist.  Her  underlying natural hair is thinning.  She does wear a wig.  CHEST:  Clear to auscultation.  There is no rhonchi, rales or wheezes.  CARDIOVASCULAR:  Regular rate, rhythm.  Positive S1, S2.  No S3, S4.  No  murmurs, rubs, or gallops.  ABDOMEN:  Soft, nontender, nondistended with positive bowel sounds.  EXTREMITIES:  She has limited range of motion in the right leg secondary to  pain but generally is able to tolerate some passive range of motion.  NEUROLOGIC:  Cranial nerves 2  through 12 appear to be intact.  Plantars are  downgoing.  She is not oriented to time or place, but today she is singing  quite beautifully in her bed.   At this time, the patient seems stable for discharge to the skilled nursing  facility, was able to be transferred.   CURRENT MEDICATION LIST:  1. Aspirin 81 mg.  2. Colace 100 mg b.i.d.  3. She takes Xalatan 1 drop to the left eye at bedtime.  4. We have her on Tylenol 650 mg p.o. q.4 hours p.r.n.  5. Percocet 1 tablet p.o. q.4 hours p.r.n. for pain.  6. She also could have MiraLax 17 g in 8 ounces p.o. daily p.r.n. for      constipation.      Melissa L. Ladona Ridgel, MD  Electronically Signed     MLT/MEDQ  D:  11/07/2005  T:  11/07/2005  Job:  [id number]   cc:   [name], M.D.  Fax: [phone]

## 2010-06-25 NOTE — Discharge Summary (Signed)
[name], [name]              ACCOUNT NO.:  [id number]   MEDICAL RECORD NO.:  [id number]          PATIENT TYPE:  INP   LOCATION:  1429                         FAC[facility]:  [facility]   PHYSICIAN:  Melissa L. Ladona Ridgel, MD  DATE OF BIRTH:  03-Feb-1920   DATE OF ADMISSION:  11/02/2005  DATE OF DISCHARGE:                                 DISCHARGE SUMMARY   CHIEF COMPLAINT ON ADMISSION:  Cannot walk secondary to a fall.   DISCHARGE DIAGNOSES:  1. Superior ramus fracture on the left.  The patient has been seen and      evaluated by orthopedics and it was determined that this is a      weightbearing as tolerated injury.  The patient continues to have      discomfort and refuses to fully weight-bear and therefore will be      placed in a skilled nursing facility for further rehabilitation.  At      rest, the patient has been comfortable with minimal analgesia.  We      would recommend using Tylenol as our first line agent; however, 1      Percocet would be appropriate if she was having physical therapy.  2. Glaucoma.  The patient should resume her eye drops, which, at the time      of admission, the patient's daughter was to bring into the hospital.  3. Dementia.  The patient is pleasantly demented, which makes it difficult      for her to understand her injury.  Supportive care will be administered      at the skilled nursing level.  4. Positive PPD.  The patient was found to have a 20 mm PPD placed for      preliminary evaluation to attend adult daycare.  The patient had a      chest x-ray in the outpatient setting, which was negative.  Her case      has been referred to the [facility] Department and she was to      follow up as an outpatient with the health care nurse at this time.  No      upper respiratory symptoms have been present.  She has, therefore, not      been placed on isolation.  She has no findings consistent with active      tuberculosis and, therefore, followup should be  with [facility]      Department.  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   MEDICATIONS AT THE TIME OF DISCHARGE:  1. The patient should resume her glaucoma medications, which, at the time,      are pending from her daughter.  2. Aspirin 81 mg once daily.  She can use Tylenol 650 mg q. 4 hours p.r.n.  3. One Percocet 5/325 every 4 hours for severe pain.   HOSPITAL COURSE:  The patient is a very pleasantly demented 77 year old  African-American female who is living with her daughter and attending adult  daycare.  The patient evidently walked off the edge of the porch and landed  in the garden below, sustaining a left superior ramus fracture.  The patient  was brought to the emergency room.  She was evaluated using x-rays and CAT  scan and seen and evaluated by orthopedics to determine that supportive care  is appropriate for this injury.  The patient was seen and evaluated by  physical therapy and was unable to participate secondary to pain.  She,  therefore, will be placed for further physical rehabilitation.  The  patient's hospital course has been unremarkable.   PHYSICAL EXAMINATION:  VITAL SIGNS ON DAY OF DISCHARGE:  Temperature:  98.6.  Blood pressure:  165/81.  Pulse:  49-64.  Respirations:  18.  Saturation  99%.  GENERAL:  She is very pleasant.  In no acute distress at rest.  HEENT:  Normocephalic, atraumatic.  Pupils equal, round and reactive to  light.  Extraocular movements intact.  Mucous membranes are moist.  NECK:  Supple.  No JVD.  No lymph nodes.  No carotid bruits.  CHEST:  Clear to auscultation.  No wheezes, rhonchi or rales.  CARDIOVASCULAR:  Regular rate and rhythm.  Positive S1, S2, no S3.  No  murmurs, rubs or gallops.  ABDOMEN:  Soft, nontender, nondistended with positive bowel sounds.  EXTREMITIES:  Full range of motion in the passive state and palpation over  the left  hip that  elicited some tenderness.  NEUROLOGIC:  The patient is awake and alert.  Cranial nerves II-XII appear  to be intact.  She is, however, not oriented to time or place.   PERTINENT LABORATORY VALUES:  Reveal a sodium 144, potassium 4.1, chloride  107, CO2 31, BUN 8, creatinine 0.9, glucose 93, calcium 9.3.  Her TSH is  within normal limits 3.121.  Urinalysis showed small leukocyte esterase but  no WBCs and her urine culture is pending.  She has been afebrile and,  therefore, has not been treated with any antibiotic therapy at this time.  Should her urine cultures become positive necessitating treatment, this  information will be conveyed to the health facility where she is  transferred.   DISPOSITION:  At this time, the patient is stable for transfer to a skilled  nursing facility with follow up with the Health Department regarding  her positive PPD.      Melissa L. Ladona Ridgel, MD  Electronically Signed     MLT/MEDQ  D:  11/04/2005  T:  11/04/2005  Job:  [id number]   cc:   Joycelyn Rua, M.D.  Fax: [phone]

## 2010-11-04 LAB — CARDIAC PANEL(CRET KIN+CKTOT+MB+TROPI)
CK, MB: 2.5
Relative Index: 1.5
Total CK: 172

## 2010-11-04 LAB — CBC
HCT: 26.2 — ABNORMAL LOW
HCT: 38
Hemoglobin: 12.6
Hemoglobin: 7.6 — CL
Hemoglobin: 9 — ABNORMAL LOW
Hemoglobin: 9 — ABNORMAL LOW
Hemoglobin: 9.1 — ABNORMAL LOW
MCHC: 33.3
MCHC: 34.3
MCHC: 34.4
MCHC: 34.5
MCV: 87.1
MCV: 88.1
MCV: 88.1
Platelets: 112 — ABNORMAL LOW
Platelets: 130 — ABNORMAL LOW
RBC: 2.51 — ABNORMAL LOW
RBC: 2.97 — ABNORMAL LOW
RBC: 3.01 — ABNORMAL LOW
RBC: 4.36
RDW: 13.9
RDW: 14.6
RDW: 14.7
WBC: 10.9 — ABNORMAL HIGH

## 2010-11-04 LAB — COMPREHENSIVE METABOLIC PANEL WITH GFR
ALT: 15
Albumin: 3.6
BUN: 15
Calcium: 9.1
Chloride: 107
Creatinine, Ser: 0.95
GFR calc Af Amer: >60
Potassium: 4
Total Protein: 6.5

## 2010-11-04 LAB — LIPID PANEL
Cholesterol: 140
HDL: 45
LDL Cholesterol: 89

## 2010-11-04 LAB — URINALYSIS, ROUTINE W REFLEX MICROSCOPIC
Bilirubin Urine: NEGATIVE
Glucose, UA: NEGATIVE
Hgb urine dipstick: NEGATIVE
Ketones, ur: 15 — AB
Nitrite: NEGATIVE
Protein, ur: NEGATIVE
Specific Gravity, Urine: 1.013
Urobilinogen, UA: 1
pH: 7

## 2010-11-04 LAB — BASIC METABOLIC PANEL
BUN: 7
CO2: 28
CO2: 28
CO2: 31
Calcium: 8 — ABNORMAL LOW
Calcium: 8.1 — ABNORMAL LOW
Chloride: 106
Chloride: 107
Creatinine, Ser: 0.79
GFR calc Af Amer: 59 — ABNORMAL LOW
GFR calc Af Amer: >60
GFR calc Af Amer: >60
GFR calc non Af Amer: 49 — ABNORMAL LOW
Glucose, Bld: 112 — ABNORMAL HIGH
Glucose, Bld: 93
Potassium: 4.4
Sodium: 140
Sodium: 140
Sodium: 143

## 2010-11-04 LAB — CROSSMATCH: Antibody Screen: NEGATIVE

## 2010-11-04 LAB — URINALYSIS, MICROSCOPIC ONLY
Bilirubin Urine: NEGATIVE
Glucose, UA: NEGATIVE
Nitrite: NEGATIVE
Protein, ur: NEGATIVE

## 2010-11-04 LAB — CULTURE, BLOOD (ROUTINE X 2): Culture: NO GROWTH

## 2010-11-04 LAB — CK TOTAL AND CKMB (NOT AT ARMC)
CK, MB: 0.8
Relative Index: INVALID
Total CK: 59

## 2010-11-04 LAB — PHOSPHORUS: Phosphorus: 3.9

## 2010-11-04 LAB — TROPONIN I: Troponin I: 0.02

## 2010-11-04 LAB — COMPREHENSIVE METABOLIC PANEL
AST: 20
Alkaline Phosphatase: 56
CO2: 27
GFR calc non Af Amer: 56 — ABNORMAL LOW
Glucose, Bld: 165 — ABNORMAL HIGH
Sodium: 143
Total Bilirubin: 1.8 — ABNORMAL HIGH

## 2010-11-04 LAB — HEMOGLOBIN AND HEMATOCRIT, BLOOD
HCT: 29.1 — ABNORMAL LOW
Hemoglobin: 10.2 — ABNORMAL LOW

## 2010-11-04 LAB — DIFFERENTIAL
Basophils Absolute: 0
Basophils Relative: 0
Eosinophils Absolute: 0
Eosinophils Relative: 0
Lymphocytes Relative: 8 — ABNORMAL LOW
Lymphs Abs: 0.9
Monocytes Absolute: 0.1
Monocytes Relative: 1 — ABNORMAL LOW
Neutro Abs: 9.9 — ABNORMAL HIGH
Neutrophils Relative %: 91 — ABNORMAL HIGH

## 2010-11-04 LAB — SAMPLE TO BLOOD BANK

## 2010-11-04 LAB — ABO/RH: ABO/RH(D): AB POS

## 2010-11-04 LAB — HOMOCYSTEINE: Homocysteine: 6.4

## 2010-11-04 LAB — CALCIUM: Calcium: 8.5

## 2010-11-04 LAB — URINE CULTURE: Special Requests: NEGATIVE

## 2013-02-25 ENCOUNTER — Non-Acute Institutional Stay (SKILLED_NURSING_FACILITY): Payer: Medicare Other | Admitting: Internal Medicine

## 2013-02-25 ENCOUNTER — Encounter: Payer: Self-pay | Admitting: Internal Medicine

## 2013-02-25 DIAGNOSIS — Z515 Encounter for palliative care: Secondary | ICD-10-CM

## 2013-02-25 DIAGNOSIS — H409 Unspecified glaucoma: Secondary | ICD-10-CM | POA: Insufficient documentation

## 2013-02-25 DIAGNOSIS — F0390 Unspecified dementia without behavioral disturbance: Secondary | ICD-10-CM

## 2013-02-25 DIAGNOSIS — H547 Unspecified visual loss: Secondary | ICD-10-CM | POA: Insufficient documentation

## 2013-02-25 DIAGNOSIS — H543 Unqualified visual loss, both eyes: Secondary | ICD-10-CM

## 2013-02-25 DIAGNOSIS — I10 Essential (primary) hypertension: Secondary | ICD-10-CM

## 2013-02-25 NOTE — Progress Notes (Signed)
MRN: [id number] Name: [name]  Sex: female Age: 80 y.o. DOB: 1919/02/15  [place] #: Helene Kelp Facility/Room: 947S Level Of Care: SNF Provider: Inocencio Homes D Emergency Contacts: Extended Emergency Contact Information Primary Emergency Contact: [facility] Address: [address],  [address] Home Phone: [phone] Relation: None  Code Status: DNR - hospice - on no meds  Allergies: Review of patient's allergies indicates no known allergies.  Chief Complaint  Patient presents with  . respite care-hospice pt    HPI: Patient is 80 y.o. female who is admitted for [place]. She is on no meds.  Past Medical History  Diagnosis Date  . Dementia without behavioral disturbance   . Blind   . Hypertension   . Glaucoma     Past Surgical History  Procedure Laterality Date  . Fracture surgery Left 2009    hip      Medication List    Notice As of 02/25/2013 11:36 AM   You have not been prescribed any medications.      No orders of the defined types were placed in this encounter.     There is no immunization history on file for this patient.  History  Substance Use Topics  . Smoking status: Never Smoker   . Smokeless tobacco: Not on file  . Alcohol Use: Not on file    Family history is noncontributory    Review of Systems - UTO sec to dementia   Filed Vitals:   02/25/13 1122  BP: 132/68  Pulse: 74  Temp: 97.8 F (36.6 C)  Resp: 18    Physical Exam  GENERAL APPEARANCE: Alert, minimallyconversant. Appropriately groomed. No acute distress.  SKIN: No diaphoresis rash HEAD: Normocephalic, atraumatic  EYES: Conjunctiva/lids clear. Pupils round, reactive. EOMs intact.  EARS: External exam WNL, canals clear. Hearing grossly normal.  NOSE: No deformity or discharge.  MOUTH/THROAT: Lips w/o lesions. RESPIRATORY: Breathing is even, unlabored. Lung sounds are clear   CARDIOVASCULAR: Heart RRR no murmurs, rubs or gallops. No  peripheral edema.  GASTROINTESTINAL: Abdomen is soft, non-tender, not distended w/ normal bowel sounds. GENITOURINARY: Bladder non tender, not distended  MUSCULOSKELETAL: No abnormal joints or musculature NEUROLOGIC: Oriented X0. Cranial nerves 2-12 grossly intact. Moves all extremities no tremor. PSYCHIATRIC: , no behavioral issues  Patient Active Problem List   Diagnosis Date Noted  . Hospice care patient 02/25/2013  . Dementia without behavioral disturbance   . Blind   . Hypertension   . Glaucoma     CBC    Component Value Date/Time   WBC 8.8 08/02/2007 0406   RBC 2.97* 08/02/2007 0406   HGB 10.2* 08/03/2007 1005   HCT 29.1* 08/03/2007 1005   PLT 130 DELTA CHECK NOTED* 08/02/2007 0406   MCV 89.1 08/02/2007 0406   LYMPHSABS 0.9 07/27/2007 2110   MONOABS 0.1 07/27/2007 2110   EOSABS 0.0 07/27/2007 2110   BASOSABS 0.0 07/27/2007 2110    CMP     Component Value Date/Time   NA 142 08/02/2007 0406   K 3.7 08/02/2007 0406   CL 106 08/02/2007 0406   CO2 31 08/02/2007 0406   GLUCOSE 93 08/02/2007 0406   BUN 7 08/02/2007 0406   CREATININE 0.79 08/02/2007 0406   CALCIUM 8.0* 08/02/2007 0406   PROT 6.5 07/27/2007 2110   ALBUMIN 3.6 07/27/2007 2110   AST 20 07/27/2007 2110   ALT 15 07/27/2007 2110   ALKPHOS 56 07/27/2007 2110   BILITOT 1.8*  07/27/2007 2110   GFRNONAA >60 08/02/2007 0406   GFRAA  Value: >60        The eGFR has been calculated using the MDRD equation. This calculation has not been validated in all clinical 08/02/2007 0406    Assessment and Plan  Hospice care patient Pt is here for respite care. She takes no medications. Priority is comfort and dignity.    Hennie Duos, MD

## 2013-02-25 NOTE — Assessment & Plan Note (Signed)
Pt is here for respite care. She takes no medications. Priority is comfort and dignity.

## 2013-05-13 ENCOUNTER — Encounter: Payer: Self-pay | Admitting: Internal Medicine

## 2013-05-13 ENCOUNTER — Non-Acute Institutional Stay (SKILLED_NURSING_FACILITY): Payer: Medicare Other | Admitting: Internal Medicine

## 2013-05-13 DIAGNOSIS — H543 Unqualified visual loss, both eyes: Secondary | ICD-10-CM

## 2013-05-13 DIAGNOSIS — I10 Essential (primary) hypertension: Secondary | ICD-10-CM

## 2013-05-13 DIAGNOSIS — H547 Unspecified visual loss: Secondary | ICD-10-CM

## 2013-05-13 DIAGNOSIS — F0390 Unspecified dementia without behavioral disturbance: Secondary | ICD-10-CM

## 2013-05-13 DIAGNOSIS — Z515 Encounter for palliative care: Secondary | ICD-10-CM

## 2013-05-13 NOTE — Progress Notes (Signed)
MRN: [id number] Name: [name]  Sex: female Age: 80 y.o. DOB: 08/04/1919  [facility]  Facility/Room:  118B Level Of Care: SNF Provider: Merrilee SeashoreALEXANDER, Alyjah Lovingood D Emergency Contacts: Extended Emergency Contact Information Primary Emergency Contact: Jones,Wanda Address: [phone]          [place], [place] [address] [place] Home Phone: [phone] Mobile Phone: [phone] Relation: Daughter Secondary Emergency Contact: Cephus,Sandy Address: [address]          [place], [place] [address] [place] Home Phone: [phone] Mobile Phone: [phone] Relation: Daughter  Code Status: DNR  Allergies: Review of patient's allergies indicates no known allergies.  Chief Complaint  Patient presents with  . hospiice respite care    HPI: Patient is 80 y.o. female who with end stage dementia, hospice, who is admitted for respite care.  Past Medical History  Diagnosis Date  . Dementia without behavioral disturbance   . Blind   . Hypertension   . Glaucoma     Past Surgical History  Procedure Laterality Date  . Fracture surgery Left 2009    hip      Medication List    Notice As of 05/13/2013  3:42 PM   You have not been prescribed any medications.     PT IS RESPITE;PT IS ON NO MEDICATIONS No orders of the defined types were placed in this encounter.     There is no immunization history on file for this patient.  History  Substance Use Topics  . Smoking status: Never Smoker   . Smokeless tobacco: Not on file  . Alcohol Use: Not on file    Family history is noncontributory    Review of Systems  UTO from pt sec to dementia  Filed Vitals:   05/13/13 1540  BP: 170/87  Pulse: 70  Temp: 97.1 F (36.2 C)  Resp: 18    Physical Exam  GENERAL APPEARANCE: Alert, nonconversant. Appropriately groomed. No acute distress.  SKIN: No diaphoresis rash, HEAD: Normocephalic, atraumatic  EYES: blind  EARS: External exam WNL, canals  clear. Hearing grossly normal.  NOSE: No deformity or discharge.  MOUTH/THROAT: Lips w/o lesions. RESPIRATORY: Breathing is even, unlabored. Lung sounds are clear   CARDIOVASCULAR: Heart RRR no murmurs, rubs or gallops. No peripheral edema.  GASTROINTESTINAL: Abdomen is soft, non-tender, not distended w/ normal bowel sounds GENITOURINARY: Bladder non tender, not distended  MUSCULOSKELETAL: No abnormal joints or musculature NEUROLOGIC: Oriented X1. Cranial nerves 2-12 grossly intact. Moves all extremities PSYCHIATRIC: Dementia, no behavioral issues  Patient Active Problem List   Diagnosis Date Noted  . Hospice care patient 02/25/2013  . Dementia without behavioral disturbance   . Blind   . Hypertension   . Glaucoma       Assessment and Plan  Pt is admitted with endstage dementia for respite care for a week. She is on no medications.  Margit HanksALEXANDER, Tyhir Schwan D, MD

## 2013-09-05 ENCOUNTER — Encounter: Payer: Self-pay | Admitting: Internal Medicine

## 2013-09-05 ENCOUNTER — Non-Acute Institutional Stay (SKILLED_NURSING_FACILITY): Payer: Medicare Other | Admitting: Internal Medicine

## 2013-09-05 DIAGNOSIS — H409 Unspecified glaucoma: Secondary | ICD-10-CM

## 2013-09-05 DIAGNOSIS — F0390 Unspecified dementia without behavioral disturbance: Secondary | ICD-10-CM

## 2013-09-05 DIAGNOSIS — H547 Unspecified visual loss: Secondary | ICD-10-CM

## 2013-09-05 DIAGNOSIS — I10 Essential (primary) hypertension: Secondary | ICD-10-CM

## 2013-09-05 DIAGNOSIS — Z515 Encounter for palliative care: Secondary | ICD-10-CM

## 2013-09-05 DIAGNOSIS — H543 Unqualified visual loss, both eyes: Secondary | ICD-10-CM

## 2013-09-05 NOTE — Assessment & Plan Note (Signed)
No meds, care only

## 2013-09-05 NOTE — Progress Notes (Signed)
MRN: [id number] Name: [name]  Sex: female Age: 80 y.o. DOB: 24-May-1919  [facility] Facility/Room: 322A Level Of Care: SNF Provider: Merrilee SeashoreALEXANDER, ANNE D Emergency Contacts: Extended Emergency Contact Information Primary Emergency Contact: Jones,Wanda Address: [phone]          [place] [address] [place] Home Phone: [phone] Mobile Phone: [phone] Relation: Daughter Secondary Emergency Contact: Cephus,Sandy Address: [address]          [place], [place] [address] [place] Home Phone: [phone] Mobile Phone: [phone] Relation: Daughter  Code Status: DNR  Allergies: Review of patient's allergies indicates no known allergies.  Chief Complaint  Patient presents with  . nursing home admission    HPI: Patient is 80 y.o. female who has dementia and blindness being admitted for respite care.  Past Medical History  Diagnosis Date  . Dementia without behavioral disturbance   . Blind   . Hypertension   . Glaucoma     Past Surgical History  Procedure Laterality Date  . Fracture surgery Left 2009    hip      Medication List    Notice As of 09/05/2013  6:58 PM   You have not been prescribed any medications.      No orders of the defined types were placed in this encounter.     There is no immunization history on file for this patient.  History  Substance Use Topics  . Smoking status: Never Smoker   . Smokeless tobacco: Not on file  . Alcohol Use: Not on file    Family history is noncontributory    Review of Systems  UTO-nursing voice no prolems    Filed Vitals:   09/05/13 1855  BP: 137/69  Pulse: 97  Temp: 96.9 F (36.1 C)  Resp: 18    Physical Exam  GENERAL APPEARANCE: Alert, minconversant. Appropriately groomed. No acute distress,very sweet BF  SKIN: No diaphoresis rash,  HEAD: Normocephalic, atraumatic  EYES: blind  EARS: External exam WNL, canals clear. Hearing grossly  normal.  NOSE: No deformity or discharge.  MOUTH/THROAT: Lips w/o lesions RESPIRATORY: Breathing is even, unlabored. Lung sounds are clear   CARDIOVASCULAR: Heart RRR no murmurs, rubs or gallops. No peripheral edema.  GASTROINTESTINAL: Abdomen is soft, non-tender, not distended w/ normal bowel sounds GENITOURINARY: Bladder non tender, not distended  MUSCULOSKELETAL: No abnormal joints or musculature NEUROLOGIC:  Moves all extremities no tremor. PSYCHIATRIC: Mood and affect appropriate to situation, no behavioral issues  Patient Active Problem List   Diagnosis Date Noted  . Hospice care patient 02/25/2013  . Dementia without behavioral disturbance   . Blind   . Hypertension   . Glaucoma         Assessment and Plan  Hospice care patient No meds, care only    Margit HanksALEXANDER, ANNE D, MD

## 2014-05-05 ENCOUNTER — Non-Acute Institutional Stay: Payer: Medicare HMO | Admitting: Internal Medicine

## 2014-05-05 DIAGNOSIS — Z515 Encounter for palliative care: Secondary | ICD-10-CM | POA: Diagnosis not present

## 2014-05-05 DIAGNOSIS — F0390 Unspecified dementia without behavioral disturbance: Secondary | ICD-10-CM | POA: Diagnosis not present

## 2014-05-05 DIAGNOSIS — H540 Blindness, both eyes: Secondary | ICD-10-CM

## 2014-05-05 DIAGNOSIS — H547 Unspecified visual loss: Secondary | ICD-10-CM

## 2014-05-05 DIAGNOSIS — I10 Essential (primary) hypertension: Secondary | ICD-10-CM | POA: Diagnosis not present

## 2014-05-05 NOTE — Progress Notes (Signed)
MRN: [id number] Name: [name]  Sex: female Age: 81 y.o. DOB: [birth year]  [place] #: Helene Kelp Facility/Room:321 Level Of Care: SNF Provider: Inocencio Homes D Emergency Contacts: Extended Emergency Contact Information Primary Emergency Contact: Jones,Wanda Address: [address]          [place], [place] [address] Johnnette Litter of [place] Phone: [phone] Mobile Phone: [phone] Relation: Daughter Secondary Emergency Contact: Cephus,Sandy Address: [place], [place] [address] Johnnette Litter of [place] Phone: [phone] Mobile Phone: [phone] Relation: Daughter  Code Status: DNR  Allergies: Review of patient's allergies indicates no known allergies.  Chief Complaint  Patient presents with  . New Admit To SNF    HPI: Patient is 81 y.o. female with endstage dementia who is admitted to SNF for respite care.  Past Medical History  Diagnosis Date  . Dementia without behavioral disturbance   . Blind   . Hypertension   . Glaucoma     Past Surgical History  Procedure Laterality Date  . Fracture surgery Left 2009    hip      Medication List    Notice  As of 05/05/2014 11:59 PM   You have not been prescribed any medications.      No orders of the defined types were placed in this encounter.     There is no immunization history on file for this patient.  History  Substance Use Topics  . Smoking status: Never Smoker   . Smokeless tobacco: Not on file  . Alcohol Use: Not on file    Family history is noncontributory    Review of Systems  DATA OBTAINED: from family member GENERAL:  no fevers, fatigue, appetite changes SKIN: No itching, rash or wounds EYES: No eye pain, redness, discharge EARS: No earache, tinnitus, change in hearing NOSE: No congestion, drainage or bleeding  MOUTH/THROAT: No mouth or tooth pain, No sore throat RESPIRATORY: No cough, wheezing, SOB CARDIAC: No chest pain, palpitations, lower extremity  edema  GI: No abdominal pain, No N/V/D or constipation, No heartburn or reflux  GU: No dysuria, frequency or urgency, or incontinence  MUSCULOSKELETAL: No unrelieved bone/joint pain NEUROLOGIC: No headache, dizziness or focal weakness PSYCHIATRIC: No overt anxiety or sadness, No behavior issue.   Filed Vitals:   05/05/14 1221  BP: 137/68  Pulse: 62  Temp: 98.3 F (36.8 C)  Resp: 16    Physical Exam  GENERAL APPEARANCE: Alert, min conversant,  No acute distress.  SKIN: No diaphoresis rash HEAD: Normocephalic, atraumatic  EYES: pt is blind, did not intrude EARS: External exam WNL, canals clear. Hearing grossly normal.  NOSE: No deformity or discharge.  MOUTH/THROAT: Lips w/o lesions  RESPIRATORY: Breathing is even, unlabored. Lung sounds are clear   CARDIOVASCULAR: Heart RRR no murmurs, rubs or gallops. No peripheral edema.   GASTROINTESTINAL: Abdomen is soft, non-tender, not distended w/ normal bowel sounds. GENITOURINARY: Bladder non tender, not distended  MUSCULOSKELETAL: No abnormal joints or musculature NEUROLOGIC:  Cranial nerves 2-12 grossly intact  PSYCHIATRIC: dementia, no behavioral issues  Patient Active Problem List   Diagnosis Date Noted  . Hospice care patient 02/25/2013  . Dementia without behavioral disturbance   . Blind   . Hypertension   . Glaucoma     CBC    Component Value Date/Time   WBC 8.8 08/02/2007 0406   RBC 2.97* 08/02/2007 0406   HGB 10.2* 08/03/2007 1005   HCT 29.1* 08/03/2007 1005  PLT 130 DELTA CHECK NOTED* 08/02/2007 0406   MCV 89.1 08/02/2007 0406   LYMPHSABS 0.9 07/27/2007 2110   MONOABS 0.1 07/27/2007 2110   EOSABS 0.0 07/27/2007 2110   BASOSABS 0.0 07/27/2007 2110    CMP     Component Value Date/Time   NA 142 08/02/2007 0406   K 3.7 08/02/2007 0406   CL 106 08/02/2007 0406   CO2 31 08/02/2007 0406   GLUCOSE 93 08/02/2007 0406   BUN 7 08/02/2007 0406   CREATININE 0.79 08/02/2007 0406   CALCIUM 8.0* 08/02/2007 0406    PROT 6.5 07/27/2007 2110   ALBUMIN 3.6 07/27/2007 2110   AST 20 07/27/2007 2110   ALT 15 07/27/2007 2110   ALKPHOS 56 07/27/2007 2110   BILITOT 1.8* 07/27/2007 2110   GFRNONAA >60 08/02/2007 0406   GFRAA  08/02/2007 0406    >60        The eGFR has been calculated using the MDRD equation. This calculation has not been validated in all clinical    Assessment and Plan  Hospice care patient Endstage dementia, admitted for respite care, pt is on no meds   Dementia without behavioral disturbance Noted   Blind Noted;need to announce yourself to pt otherwise she gets upset   Hypertension Noted     Hennie Duos, MD

## 2014-05-12 ENCOUNTER — Encounter: Payer: Self-pay | Admitting: Internal Medicine

## 2014-05-12 NOTE — Assessment & Plan Note (Signed)
Noted  

## 2014-05-12 NOTE — Assessment & Plan Note (Signed)
Noted;need to announce yourself to pt otherwise she gets upset

## 2014-05-12 NOTE — Assessment & Plan Note (Signed)
Endstage dementia, admitted for respite care, pt is on no meds

## 2014-07-01 ENCOUNTER — Encounter (HOSPITAL_COMMUNITY): Payer: Self-pay | Admitting: Emergency Medicine

## 2014-07-01 ENCOUNTER — Emergency Department (HOSPITAL_COMMUNITY)
Admission: EM | Admit: 2014-07-01 | Discharge: 2014-07-02 | Disposition: A | Discharge status: Home / Self Care | Payer: Medicare HMO | Attending: Emergency Medicine | Admitting: Emergency Medicine

## 2014-07-01 DIAGNOSIS — N390 Urinary tract infection, site not specified: Secondary | ICD-10-CM | POA: Insufficient documentation

## 2014-07-01 DIAGNOSIS — H540 Blindness, both eyes: Secondary | ICD-10-CM | POA: Insufficient documentation

## 2014-07-01 DIAGNOSIS — F0390 Unspecified dementia without behavioral disturbance: Secondary | ICD-10-CM | POA: Diagnosis not present

## 2014-07-01 DIAGNOSIS — R319 Hematuria, unspecified: Secondary | ICD-10-CM | POA: Diagnosis present

## 2014-07-01 DIAGNOSIS — I10 Essential (primary) hypertension: Secondary | ICD-10-CM | POA: Diagnosis not present

## 2014-07-01 DIAGNOSIS — R195 Other fecal abnormalities: Secondary | ICD-10-CM | POA: Diagnosis not present

## 2014-07-01 LAB — I-STAT CHEM 8, ED
BUN: 16 mg/dL (ref 6–20)
Calcium, Ion: 1.09 mmol/L — ABNORMAL LOW (ref 1.13–1.30)
Chloride: 103 mmol/L (ref 101–111)
Creatinine, Ser: 0.9 mg/dL (ref 0.44–1.00)
GLUCOSE: 133 mg/dL — AB (ref 65–99)
HCT: 51 % — ABNORMAL HIGH (ref 36.0–46.0)
HEMOGLOBIN: 17.3 g/dL — AB (ref 12.0–15.0)
Potassium: 3.7 mmol/L (ref 3.5–5.1)
Sodium: 140 mmol/L (ref 135–145)
TCO2: 23 mmol/L (ref 0–100)

## 2014-07-01 LAB — URINALYSIS, ROUTINE W REFLEX MICROSCOPIC
Bilirubin Urine: NEGATIVE
GLUCOSE, UA: NEGATIVE mg/dL
Ketones, ur: NEGATIVE mg/dL
Leukocytes, UA: NEGATIVE
NITRITE: POSITIVE — AB
Protein, ur: NEGATIVE mg/dL
Specific Gravity, Urine: 1.012 (ref 1.005–1.030)
Urobilinogen, UA: 1 mg/dL (ref 0.0–1.0)
pH: 5.5 (ref 5.0–8.0)

## 2014-07-01 LAB — CBC WITH DIFFERENTIAL/PLATELET
Basophils Absolute: 0 10*3/uL (ref 0.0–0.1)
Basophils Relative: 0 % (ref 0–1)
EOS PCT: 0 % (ref 0–5)
Eosinophils Absolute: 0 10*3/uL (ref 0.0–0.7)
HEMATOCRIT: 44.7 % (ref 36.0–46.0)
Hemoglobin: 15.7 g/dL — ABNORMAL HIGH (ref 12.0–15.0)
LYMPHS ABS: 2.5 10*3/uL (ref 0.7–4.0)
Lymphocytes Relative: 21 % (ref 12–46)
MCH: 28.6 pg (ref 26.0–34.0)
MCHC: 35.1 g/dL (ref 30.0–36.0)
MCV: 81.6 fL (ref 78.0–100.0)
Monocytes Absolute: 1 10*3/uL (ref 0.1–1.0)
Monocytes Relative: 8 % (ref 3–12)
Neutro Abs: 8.7 10*3/uL — ABNORMAL HIGH (ref 1.7–7.7)
Neutrophils Relative %: 71 % (ref 43–77)
PLATELETS: 172 10*3/uL (ref 150–400)
RBC: 5.48 MIL/uL — ABNORMAL HIGH (ref 3.87–5.11)
RDW: 13.6 % (ref 11.5–15.5)
WBC: 12.2 10*3/uL — ABNORMAL HIGH (ref 4.0–10.5)

## 2014-07-01 LAB — URINE MICROSCOPIC-ADD ON

## 2014-07-01 LAB — POC OCCULT BLOOD, ED: Fecal Occult Bld: POSITIVE — AB

## 2014-07-01 MED ORDER — STERILE WATER FOR INJECTION IJ SOLN
2.1000 mL | Freq: Once | INTRAMUSCULAR | Status: AC
  Administered 2014-07-01: 10 mL via INTRAMUSCULAR

## 2014-07-01 MED ORDER — CEPHALEXIN 250 MG/5ML PO SUSR: 250.0000 mg | Freq: Four times a day (QID) | ORAL | Status: AC

## 2014-07-01 MED ORDER — STERILE WATER FOR INJECTION IJ SOLN
INTRAMUSCULAR | Status: AC
  Administered 2014-07-01: 10 mL via INTRAMUSCULAR
  Filled 2014-07-01: qty 10

## 2014-07-01 MED ORDER — CEFTRIAXONE SODIUM 1 G IJ SOLR
1.0000 g | Freq: Once | INTRAMUSCULAR | Status: AC
  Administered 2014-07-01: 1 g via INTRAMUSCULAR
  Filled 2014-07-01: qty 10

## 2014-07-01 NOTE — ED Notes (Signed)
Pt arrives from home via [facility] for c/o hematuria that began today. EMS also advised that patient has not been eating or drinking like she normally does. Pt is in end stage dementia but mental status is at baseline according to family. resp e/u, nad.

## 2014-07-01 NOTE — ED Provider Notes (Signed)
  Face-to-face evaluation   History: Patient with abnormal-appearing urine for about 24 hours, associated with decreased appetite today. Her mental status is near her baseline.  Physical exam: Elderly, malnourished appearing individual. Heart regular rate and rhythm. Lungs clear anterior. Abdomen soft, mild diffuse tenderness.  Medical screening examination/treatment/procedure(s) were conducted as a shared visit with non-physician practitioner(s) and myself.  I personally evaluated the patient during the encounter  Mancel BaleElliott Sumaiyah Markert, MD 07/02/14 [phone]

## 2014-07-01 NOTE — ED Notes (Signed)
Patients daughter Burna MortimerWanda at bedside, given discharge papers and instructions and daughter signed in acknowledgement. Advised that patient will need transport home as she is unable to get her there via car.

## 2014-07-01 NOTE — ED Provider Notes (Signed)
CSN: [id number]     Arrival date & time 07/01/14  2101 History   First MD Initiated Contact with Patient 07/01/14 2126     Chief Complaint  Patient presents with  . Hematuria     (Consider location/radiation/quality/duration/timing/severity/associated sxs/prior Treatment) HPI Comments: C 81 year old female with advanced dementia who is cared for at home by her daughter.  Daughter states that last night at dinner.  She vomited her meal but was subsequently able to eat some lunch.  Tonight at dinner.  She does refuse to eat or drink during toileting.  Daughter noticed that the commode had dark blood in the commode.  She was unable to ascertain if there was stool in the commode as well.  This concerned her, so she brought her mother to the emergency department for evaluation. Patient does not ambulate independently but will stand and walk with her daughter with assistance.  Daughter denies any falls, recent fevers.  She takes no medication on a regular basis. She has no OB/GYN history of any pathology of concern.  No previous history of hematuria or GI bleed  Patient is a 81 y.o. female presenting with hematuria. The history is provided by a relative and a caregiver.  Hematuria This is a new problem. The current episode started today. The problem has been unchanged. Associated symptoms include vomiting. Pertinent negatives include no rash. Nothing aggravates the symptoms. She has tried nothing for the symptoms. The treatment provided no relief.    Past Medical History  Diagnosis Date  . Dementia without behavioral disturbance   . Blind   . Hypertension   . Glaucoma    Past Surgical History  Procedure Laterality Date  . Fracture surgery Left 2009    hip   No family history on file. History  Substance Use Topics  . Smoking status: Never Smoker   . Smokeless tobacco: Not on file  . Alcohol Use: Not on file   OB History    No data available     Review of Systems  Unable to perform  ROS: Dementia  Gastrointestinal: Positive for vomiting. Negative for diarrhea and constipation.  Genitourinary: Positive for hematuria.  Skin: Negative for rash.  All other systems reviewed and are negative.     Allergies  Review of patient's allergies indicates no known allergies.  Home Medications   Prior to Admission medications   Medication Sig Start Date End Date Taking? Authorizing Provider  cephALEXin (KEFLEX) 250 MG/5ML suspension Take 5 mLs (250 mg total) by mouth 4 (four) times daily. 07/01/14 07/08/14  Earley FavorGail Aalaya Yadao, NP   BP 125/72 mmHg  Pulse 76  Temp(Src) 98.1 F (36.7 C) (Oral)  Resp 12  SpO2 100% Physical Exam  Constitutional: She appears well-developed and well-nourished.  HENT:  Head: Normocephalic.  Cardiovascular: Normal rate and regular rhythm.   Pulmonary/Chest: Effort normal and breath sounds normal.  Abdominal: Soft. Bowel sounds are normal. She exhibits no distension. There is no tenderness.  Neurological:  Per family member,  at baseline mental status  Skin: No rash noted.  Nursing note and vitals reviewed.   ED Course  Procedures (including critical care time) Labs Review Labs Reviewed  CBC WITH DIFFERENTIAL/PLATELET - Abnormal; Notable for the following:    WBC 12.2 (*)    RBC 5.48 (*)    Hemoglobin 15.7 (*)    Neutro Abs 8.7 (*)    All other components within normal limits  URINALYSIS, ROUTINE W REFLEX MICROSCOPIC - Abnormal; Notable for the following:  APPearance CLOUDY (*)    Hgb urine dipstick LARGE (*)    Nitrite POSITIVE (*)    All other components within normal limits  I-STAT CHEM 8, ED - Abnormal; Notable for the following:    Glucose, Bld 133 (*)    Calcium, Ion 1.09 (*)    Hemoglobin 17.3 (*)    HCT 51.0 (*)    All other components within normal limits  POC OCCULT BLOOD, ED - Abnormal; Notable for the following:    Fecal Occult Bld POSITIVE (*)    All other components within normal limits  URINE CULTURE  URINE  MICROSCOPIC-ADD ON    Imaging Review No results found.   EKG Interpretation None     patient's lab results have been discussed with her daughter.  She is aware that she has microscopic blood in her stool.  She does have hematuria and urinary tract infection.  She has been given a shot of Rocephin in the emergency department and started on Keflex in oral suspension as her mother will not swallow a pill.  She is to follow-up with her primary care physician per routine or she develops weakness, fever, vomiting, worsening bleeding.  She is to return immediately to the emergency room for further evaluation  MDM   Final diagnoses:  UTI (lower urinary tract infection)  Hematuria  Occult blood in stools         Earley Favor, NP 07/01/14 6045  Mancel Bale, MD 07/02/14 4098

## 2014-07-01 NOTE — Discharge Instructions (Signed)
You mother does have a condition called hemorrhagic cystitis, which is a urinary tract infection with blood in the urine.  She has been started on antigravity.  She received an injection in the emergency department.  She's been discharged home with a medication called Keflex, which is an oral suspension that she can take in small amounts frequently throughout the next 7 days.  She also had microscopic blood in her stool, which will need to be monitored by her primary care physician.  Her hemoglobin and hematocrit are more than adequate at this point, indicating a gross hemorrhaging.  If at anytime you.  Mother develops a fever or bleeding becomes weak or dizzy.  Please return immediately for further evaluation

## 2014-07-02 NOTE — ED Notes (Signed)
ptar at bedside to transport patient.  

## 2014-07-03 LAB — URINE CULTURE
COLONY COUNT: NO GROWTH
Culture: NO GROWTH

## 2014-11-08 DEATH — deceased
